# Patient Record
Sex: Female | Born: 1960
Health system: Southern US, Community
[De-identification: ages and names within clinical notes are randomized; demographics above are authoritative.]

---

## 2005-12-02 ENCOUNTER — Emergency Department: Payer: Self-pay | Admitting: Emergency Medicine

## 2018-01-15 ENCOUNTER — Other Ambulatory Visit: Payer: Self-pay

## 2018-01-15 ENCOUNTER — Ambulatory Visit
Admission: EM | Admit: 2018-01-15 | Discharge: 2018-01-15 | Disposition: A | Discharge status: Home / Self Care | Payer: Self-pay | Attending: Family Medicine | Admitting: Family Medicine

## 2018-01-15 DIAGNOSIS — A084 Viral intestinal infection, unspecified: Secondary | ICD-10-CM

## 2018-01-15 LAB — BASIC METABOLIC PANEL
ANION GAP: 10 (ref 5–15)
BUN: 9 mg/dL (ref 6–20)
CO2: 23 mmol/L (ref 22–32)
Calcium: 8.9 mg/dL (ref 8.9–10.3)
Chloride: 104 mmol/L (ref 101–111)
Creatinine, Ser: 0.86 mg/dL (ref 0.44–1.00)
Glucose, Bld: 97 mg/dL (ref 65–99)
POTASSIUM: 3.5 mmol/L (ref 3.5–5.1)
SODIUM: 137 mmol/L (ref 135–145)

## 2018-01-15 MED ORDER — ONDANSETRON 8 MG PO TBDP: 8.0000 mg | ORAL_TABLET | Freq: Three times a day (TID) | ORAL | 0 refills | Status: DC | PRN

## 2018-01-15 MED ORDER — ONDANSETRON 4 MG PO TBDP
4.0000 mg | ORAL_TABLET | Freq: Once | ORAL | Status: AC
  Administered 2018-01-15: 4 mg via ORAL

## 2018-01-15 NOTE — ED Provider Notes (Signed)
[facility]    CSN: [id number] Arrival date & time: 01/15/18  1427     History   Chief Complaint Chief Complaint  Patient presents with  . Nausea  . Diarrhea    HPI Doristine Johnsngelia M Kratochvil is a 57 y.o. female.   The history is provided by the patient.  Diarrhea  Quality:  Watery Severity:  Moderate Onset quality:  Sudden Number of episodes:  15 Duration:  3 days Timing:  Constant Progression:  Unchanged Relieved by:  None tried Associated symptoms: chills, URI and vomiting   Associated symptoms: no abdominal pain, no arthralgias, no recent cough, no diaphoresis, no headaches and no myalgias   Risk factors: no recent antibiotic use and no suspicious food intake     History reviewed. No pertinent past medical history.  There are no active problems to display for this patient.   Past Surgical History:  Procedure Laterality Date  . CESAREAN SECTION      OB History   None      Home Medications    Prior to Admission medications   Medication Sig Start Date End Date Taking? Authorizing Provider  ondansetron (ZOFRAN ODT) 8 MG disintegrating tablet Take 1 tablet (8 mg total) by mouth every 8 (eight) hours as needed. 01/15/18   Payton Mccallumonty, Jagjit Riner, MD    Family History History reviewed. No pertinent family history.  Social History Social History   Tobacco Use  . Smoking status: Current Some Day Smoker  . Smokeless tobacco: Never Used  Substance Use Topics  . Alcohol use: Not Currently  . Drug use: Not Currently     Allergies   Patient has no known allergies.   Review of Systems Review of Systems  Constitutional: Positive for chills. Negative for diaphoresis.  Gastrointestinal: Positive for diarrhea and vomiting. Negative for abdominal pain.  Musculoskeletal: Negative for arthralgias and myalgias.  Neurological: Negative for headaches.     Physical Exam Triage Vital Signs ED Triage Vitals  Enc Vitals Group     BP 01/15/18 1454 110/70   Pulse Rate 01/15/18 1454 78     Resp 01/15/18 1454 18     Temp 01/15/18 1454 98.2 F (36.8 C)     Temp src --      SpO2 01/15/18 1454 99 %     Weight 01/15/18 1455 161 lb (73 kg)     Height 01/15/18 1455 5' 0.5" (1.537 m)     Head Circumference --      Peak Flow --      Pain Score 01/15/18 1455 0     Pain Loc --      Pain Edu? --      Excl. in GC? --    No data found.  Updated Vital Signs BP 110/70 (BP Location: Right Arm)   Pulse 78   Temp 98.2 F (36.8 C)   Resp 18   Ht 5' 0.5" (1.537 m)   Wt 161 lb (73 kg)   SpO2 99%   BMI 30.93 kg/m   Visual Acuity Right Eye Distance:   Left Eye Distance:   Bilateral Distance:    Right Eye Near:   Left Eye Near:    Bilateral Near:     Physical Exam  Constitutional: She appears well-developed and well-nourished. No distress.  Abdominal: Soft. Bowel sounds are normal. She exhibits no distension and no mass. There is tenderness (mild; diffuse; no rebound or guarding). There is no rebound and no guarding.  Skin: She is  not diaphoretic.  Nursing note and vitals reviewed.    UC Treatments / Results  Labs (all labs ordered are listed, but only abnormal results are displayed) Labs Reviewed  BASIC METABOLIC PANEL    EKG None Radiology No results found.  Procedures Procedures (including critical care time)  Medications Ordered in UC Medications  ondansetron (ZOFRAN-ODT) disintegrating tablet 4 mg (4 mg Oral Given 01/15/18 1551)     Initial Impression / Assessment and Plan / UC Course  I have reviewed the triage vital signs and the nursing notes.  Pertinent labs & imaging results that were available during my care of the patient were reviewed by me and considered in my medical decision making (see chart for details).       Final Clinical Impressions(s) / UC Diagnoses   Final diagnoses:  Viral gastroenteritis    ED Discharge Orders        Ordered    ondansetron (ZOFRAN ODT) 8 MG disintegrating tablet  Every 8  hours PRN     01/15/18 1629     1. Lab results and diagnosis reviewed with patient 2. rx as per orders above; reviewed possible side effects, interactions, risks and benefits  3. Recommend supportive treatment with clear liquids, then advance slowly as tolerated  4. Follow-up prn if symptoms worsen or don't improve  Controlled Substance Prescriptions [place] Controlled Substance Registry consulted? Not Applicable   Payton Mccallum, MD 01/15/18 [phone]

## 2018-01-15 NOTE — ED Triage Notes (Signed)
2 weeks of feeling sick. Last weekend was vomiting. Then felt like she had the flu with achiness and cough. Those sx started to improve but she is now having n/v/d every time she eats.

## 2018-02-20 ENCOUNTER — Ambulatory Visit
Admission: EM | Admit: 2018-02-20 | Discharge: 2018-02-20 | Disposition: A | Discharge status: Home / Self Care | Payer: Self-pay | Attending: Family Medicine | Admitting: Family Medicine

## 2018-02-20 ENCOUNTER — Encounter: Payer: Self-pay | Admitting: *Deleted

## 2018-02-20 DIAGNOSIS — J0100 Acute maxillary sinusitis, unspecified: Secondary | ICD-10-CM

## 2018-02-20 DIAGNOSIS — R112 Nausea with vomiting, unspecified: Secondary | ICD-10-CM

## 2018-02-20 MED ORDER — ONDANSETRON 8 MG PO TBDP: 8.0000 mg | ORAL_TABLET | Freq: Three times a day (TID) | ORAL | 0 refills | Status: DC | PRN

## 2018-02-20 MED ORDER — AMOXICILLIN 875 MG PO TABS: 875.0000 mg | ORAL_TABLET | Freq: Two times a day (BID) | ORAL | 0 refills | Status: DC

## 2018-02-20 NOTE — ED Provider Notes (Signed)
[facility]    CSN: [id number] Arrival date & time: 02/20/18  1920     History   Chief Complaint Chief Complaint  Patient presents with  . Dizziness    HPI Tammie Berry is a 57 y.o. female.   The history is provided by the patient.  Dizziness  URI  Presenting symptoms: congestion, facial pain and rhinorrhea   Severity:  Moderate Onset quality:  Sudden Duration:  1 week Timing:  Constant Progression:  Worsening Chronicity:  New Relieved by:  None tried Ineffective treatments:  None tried Associated symptoms: myalgias   Associated symptoms: no wheezing   Associated symptoms comment:  Nausea, vomiting and diarrhea Risk factors: not elderly, no chronic cardiac disease, no chronic kidney disease, no chronic respiratory disease, no diabetes mellitus, no immunosuppression, no recent illness, no recent travel and no sick contacts     History reviewed. No pertinent past medical history.  There are no active problems to display for this patient.   Past Surgical History:  Procedure Laterality Date  . CESAREAN SECTION      OB History   None      Home Medications    Prior to Admission medications   Medication Sig Start Date End Date Taking? Authorizing Provider  fexofenadine (ALLEGRA) 60 MG tablet Take 60 mg by mouth 2 (two) times daily.   Yes [provider]  amoxicillin (AMOXIL) 875 MG tablet Take 1 tablet (875 mg total) by mouth 2 (two) times daily. 02/20/18   Payton Mccallum, MD  ondansetron (ZOFRAN ODT) 8 MG disintegrating tablet Take 1 tablet (8 mg total) by mouth every 8 (eight) hours as needed. 02/20/18   Payton Mccallum, MD    Family History History reviewed. No pertinent family history.  Social History Social History   Tobacco Use  . Smoking status: Current Some Day Smoker  . Smokeless tobacco: Never Used  Substance Use Topics  . Alcohol use: Not Currently  . Drug use: Not Currently     Allergies   Patient has no known  allergies.   Review of Systems Review of Systems  HENT: Positive for congestion and rhinorrhea.   Respiratory: Negative for wheezing.   Musculoskeletal: Positive for myalgias.  Neurological: Positive for dizziness.     Physical Exam Triage Vital Signs ED Triage Vitals  Enc Vitals Group     BP 02/20/18 1929 119/80     Pulse Rate 02/20/18 1929 85     Resp 02/20/18 1929 18     Temp 02/20/18 1929 98.1 F (36.7 C)     Temp Source 02/20/18 1929 Oral     SpO2 02/20/18 1929 100 %     Weight 02/20/18 1928 165 lb (74.8 kg)     Height 02/20/18 1928 5' (1.524 m)     Head Circumference --      Peak Flow --      Pain Score 02/20/18 1927 5     Pain Loc --      Pain Edu? --      Excl. in GC? --    No data found.  Updated Vital Signs BP 119/80 (BP Location: Left Arm)   Pulse 85   Temp 98.1 F (36.7 C) (Oral)   Resp 18   Ht 5' (1.524 m)   Wt 165 lb (74.8 kg)   SpO2 100%   BMI 32.22 kg/m   Visual Acuity Right Eye Distance:   Left Eye Distance:   Bilateral Distance:    Right  Eye Near:   Left Eye Near:    Bilateral Near:     Physical Exam  Constitutional: She appears well-developed and well-nourished. No distress.  HENT:  Head: Normocephalic and atraumatic.  Right Ear: Tympanic membrane, external ear and ear canal normal.  Left Ear: Tympanic membrane, external ear and ear canal normal.  Nose: Mucosal edema and rhinorrhea present. No nose lacerations, sinus tenderness, nasal deformity, septal deviation or nasal septal hematoma. No epistaxis.  No foreign bodies. Right sinus exhibits maxillary sinus tenderness and frontal sinus tenderness. Left sinus exhibits maxillary sinus tenderness and frontal sinus tenderness.  Mouth/Throat: Uvula is midline, oropharynx is clear and moist and mucous membranes are normal. No oropharyngeal exudate.  Eyes: Conjunctivae are normal. Right eye exhibits no discharge. Left eye exhibits no discharge. No scleral icterus.  Neck: Normal range of  motion. Neck supple. No thyromegaly present.  Cardiovascular: Normal rate, regular rhythm and normal heart sounds.  Pulmonary/Chest: Effort normal and breath sounds normal. No stridor. No respiratory distress. She has no wheezes. She has no rales.  Abdominal: Soft. Bowel sounds are normal. She exhibits no distension and no mass. There is no tenderness. There is no rebound and no guarding. No hernia.  Lymphadenopathy:    She has no cervical adenopathy.  Skin: She is not diaphoretic.  Nursing note and vitals reviewed.    UC Treatments / Results  Labs (all labs ordered are listed, but only abnormal results are displayed) Labs Reviewed - No data to display  EKG None  Radiology No results found.  Procedures Procedures (including critical care time)  Medications Ordered in UC Medications - No data to display  Initial Impression / Assessment and Plan / UC Course  I have reviewed the triage vital signs and the nursing notes.  Pertinent labs & imaging results that were available during my care of the patient were reviewed by me and considered in my medical decision making (see chart for details).      Final Clinical Impressions(s) / UC Diagnoses   Final diagnoses:  Acute maxillary sinusitis, recurrence not specified  Non-intractable vomiting with nausea, unspecified vomiting type    ED Prescriptions    Medication Sig Dispense Auth. Provider   amoxicillin (AMOXIL) 875 MG tablet Take 1 tablet (875 mg total) by mouth 2 (two) times daily. 20 tablet Ariyanna Oien, Pamala Hurry, MD   ondansetron (ZOFRAN ODT) 8 MG disintegrating tablet Take 1 tablet (8 mg total) by mouth every 8 (eight) hours as needed. 6 tablet Payton Mccallum, MD     1. diagnosis reviewed with patient 2. rx as per orders above; reviewed possible side effects, interactions, risks and benefits  3. Recommend supportive treatment with increased fluids  4. Follow-up prn if symptoms worsen or don't improve  Controlled Substance  Prescriptions [place] Controlled Substance Registry consulted? Not Applicable   Payton Mccallum, MD 02/20/18 2014

## 2018-02-20 NOTE — ED Triage Notes (Signed)
C/o dizziness, vomiting, diarrhea, sinus pressure and body aches. Pt started having these symptoms a week ago.

## 2018-08-31 ENCOUNTER — Other Ambulatory Visit: Payer: Self-pay

## 2018-08-31 ENCOUNTER — Ambulatory Visit
Admission: EM | Admit: 2018-08-31 | Discharge: 2018-08-31 | Disposition: A | Discharge status: Home / Self Care | Payer: 59 | Attending: Family Medicine | Admitting: Family Medicine

## 2018-08-31 ENCOUNTER — Encounter: Payer: Self-pay | Admitting: Emergency Medicine

## 2018-08-31 ENCOUNTER — Ambulatory Visit (INDEPENDENT_AMBULATORY_CARE_PROVIDER_SITE_OTHER): Payer: 59

## 2018-08-31 DIAGNOSIS — M25551 Pain in right hip: Secondary | ICD-10-CM | POA: Diagnosis not present

## 2018-08-31 DIAGNOSIS — M545 Low back pain: Secondary | ICD-10-CM | POA: Diagnosis not present

## 2018-08-31 DIAGNOSIS — M542 Cervicalgia: Secondary | ICD-10-CM

## 2018-08-31 DIAGNOSIS — M7918 Myalgia, other site: Secondary | ICD-10-CM | POA: Diagnosis not present

## 2018-08-31 MED ORDER — MELOXICAM 15 MG PO TABS: 15.0000 mg | ORAL_TABLET | Freq: Every day | ORAL | 0 refills | Status: DC | PRN

## 2018-08-31 NOTE — Discharge Instructions (Signed)
Xrays normal.  Medication as prescribed.  Take care  Dr. Adriana Simas

## 2018-08-31 NOTE — ED Provider Notes (Signed)
[facility]    CSN: [id number] Arrival date & time: 08/31/18  1124  History   Chief Complaint Chief Complaint  Patient presents with  . Knee Pain   HPI  57 year old female presents with pain.  Patient states that she initially developed right knee pain after hitting her knee on a metal bar.  Patient states that this occurred 1.5 months ago.  Pain has resolved in her knee but now she is having pain in the "right hip".  Patient localizes the pain to the anterolateral thigh.  She states it is worse with certain movements.  Denies back pain.  States that the pain is moderate to severe.  No relieving factors.  No medications or interventions tried.  No other complaints.  Social Hx reviewed as below. Social History Social History   Tobacco Use  . Smoking status: Current Some Day Smoker  . Smokeless tobacco: Never Used  Substance Use Topics  . Alcohol use: Yes    Comment: socially  . Drug use: Not Currently   Allergies   Patient has no known allergies.  Review of Systems Review of Systems  Constitutional: Negative.   Musculoskeletal:       Right hip pain.   Physical Exam Triage Vital Signs ED Triage Vitals  Enc Vitals Group     BP 08/31/18 1200 112/78     Pulse Rate 08/31/18 1200 74     Resp 08/31/18 1200 18     Temp 08/31/18 1200 98.2 F (36.8 C)     Temp Source 08/31/18 1200 Oral     SpO2 08/31/18 1200 100 %     Weight 08/31/18 1158 162 lb (73.5 kg)     Height 08/31/18 1158 5' (1.524 m)     Head Circumference --      Peak Flow --      Pain Score 08/31/18 1158 4     Pain Loc --      Pain Edu? --      Excl. in GC? --    Updated Vital Signs BP 112/78 (BP Location: Right Arm)   Pulse 74   Temp 98.2 F (36.8 C) (Oral)   Resp 18   Ht 5' (1.524 m)   Wt 73.5 kg   SpO2 100%   BMI 31.64 kg/m   Visual Acuity Right Eye Distance:   Left Eye Distance:   Bilateral Distance:    Right Eye Near:   Left Eye Near:    Bilateral Near:     Physical Exam   Constitutional: She is oriented to person, place, and time. She appears well-developed. No distress.  Cardiovascular: Normal rate and regular rhythm.  Pulmonary/Chest: Effort normal and breath sounds normal. She has no wheezes. She has no rales.  Musculoskeletal:  Right knee -no tenderness.  No effusion.  Ligaments intact.  Normal exam of the knee.  Right hip - pain with FADIR.  Tenderness around the IT band.   Neurological: She is alert and oriented to person, place, and time.  Psychiatric: She has a normal mood and affect. Her behavior is normal.  Nursing note and vitals reviewed.  UC Treatments / Results  Labs (all labs ordered are listed, but only abnormal results are displayed) Labs Reviewed - No data to display  EKG None  Radiology Dg Lumbar Spine Complete  Result Date: 08/31/2018 CLINICAL DATA:  Low back pain after injury 1 month ago. EXAM: LUMBAR SPINE - COMPLETE 4+ VIEW COMPARISON:  None. FINDINGS: There is no evidence  of lumbar spine fracture. Alignment is normal. Intervertebral disc spaces are maintained. IMPRESSION: Negative. Electronically Signed   By: Lupita Raider, M.D.   On: 08/31/2018 12:56   Dg Hip Unilat W Or Wo Pelvis 2-3 Views Right  Result Date: 08/31/2018 CLINICAL DATA:  Right hip pain after injury 1 month ago. EXAM: DG HIP (WITH OR WITHOUT PELVIS) 2-3V RIGHT COMPARISON:  None. FINDINGS: There is no evidence of hip fracture or dislocation. There is no evidence of arthropathy or other focal bone abnormality. IMPRESSION: Negative. Electronically Signed   By: Lupita Raider, M.D.   On: 08/31/2018 12:55    Procedures Procedures (including critical care time)  Medications Ordered in UC Medications - No data to display  Initial Impression / Assessment and Plan / UC Course  I have reviewed the triage vital signs and the nursing notes.  Pertinent labs & imaging results that were available during my care of the patient were reviewed by me and considered in my  medical decision making (see chart for details).    57 year old female presents with muscular skeletal pain.  X-rays negative.  Exam unrevealing.  Placing on meloxicam.  Supportive care.  Final Clinical Impressions(s) / UC Diagnoses   Final diagnoses:  Musculoskeletal pain     Discharge Instructions     Xrays normal.  Medication as prescribed.  Take care  Dr. Adriana Simas    ED Prescriptions    Medication Sig Dispense Auth. Provider   meloxicam (MOBIC) 15 MG tablet Take 1 tablet (15 mg total) by mouth daily as needed. 30 tablet Tommie Sams, DO     Controlled Substance Prescriptions [place] Controlled Substance Registry consulted? Not Applicable   Tommie Sams, DO 08/31/18 1313

## 2018-08-31 NOTE — ED Triage Notes (Signed)
Patient c/o hitting her knee on a metal bar at work 2 months ago. Patient stated she has continued to have pain and now the pain is radiating up into her right leg and hip.

## 2019-02-07 ENCOUNTER — Other Ambulatory Visit: Payer: Self-pay

## 2019-02-07 ENCOUNTER — Ambulatory Visit (INDEPENDENT_AMBULATORY_CARE_PROVIDER_SITE_OTHER)
Admit: 2019-02-07 | Discharge: 2019-02-07 | Disposition: A | Discharge status: Home / Self Care | Payer: 59 | Attending: Family Medicine | Admitting: Family Medicine

## 2019-02-07 ENCOUNTER — Encounter: Payer: Self-pay | Admitting: Emergency Medicine

## 2019-02-07 ENCOUNTER — Ambulatory Visit
Admission: EM | Admit: 2019-02-07 | Discharge: 2019-02-07 | Disposition: A | Discharge status: Home / Self Care | Payer: 59 | Attending: Family Medicine | Admitting: Family Medicine

## 2019-02-07 DIAGNOSIS — R42 Dizziness and giddiness: Secondary | ICD-10-CM | POA: Diagnosis not present

## 2019-02-07 DIAGNOSIS — E876 Hypokalemia: Secondary | ICD-10-CM

## 2019-02-07 DIAGNOSIS — F17210 Nicotine dependence, cigarettes, uncomplicated: Secondary | ICD-10-CM | POA: Diagnosis not present

## 2019-02-07 LAB — BASIC METABOLIC PANEL
Anion gap: 8 (ref 5–15)
BUN: 8 mg/dL (ref 6–20)
CO2: 23 mmol/L (ref 22–32)
Calcium: 9 mg/dL (ref 8.9–10.3)
Chloride: 106 mmol/L (ref 98–111)
Creatinine, Ser: 0.77 mg/dL (ref 0.44–1.00)
GFR calc Af Amer: >60 mL/min (ref >60)
GFR calc non Af Amer: >60 mL/min (ref >60)
Glucose, Bld: 92 mg/dL (ref 70–99)
Potassium: 3.2 mmol/L — ABNORMAL LOW (ref 3.5–5.1)
Sodium: 137 mmol/L (ref 135–145)

## 2019-02-07 MED ORDER — MECLIZINE HCL 25 MG PO TABS: 25.0000 mg | ORAL_TABLET | Freq: Three times a day (TID) | ORAL | 0 refills | Status: DC | PRN

## 2019-02-07 MED ORDER — POTASSIUM CHLORIDE CRYS ER 20 MEQ PO TBCR
40.0000 meq | EXTENDED_RELEASE_TABLET | Freq: Once | ORAL | Status: AC
  Administered 2019-02-07: 40 meq via ORAL

## 2019-02-07 NOTE — Discharge Instructions (Signed)
Take medication as prescribed. REST. Drink plenty of fluids. Monitor diet.  Follow up with your primary care physician this week for follow up. Return to Urgent care or emergency room for new or worsening concerns.

## 2019-02-07 NOTE — ED Triage Notes (Signed)
Patient c/o dizziness that started on Monday. She stated during her shift she became dizzy and had to go home. She states she has had episodes every day. Denies chest pain.  Patient also reports that she is feeling more anxious than usual.

## 2019-02-07 NOTE — ED Provider Notes (Signed)
[facility] ____________________________________________  Time seen: Approximately 3:47 PM  I have reviewed the triage vital signs and the nursing notes.   HISTORY  Chief Complaint Dizziness   HPI Tammie Berry is a 58 y.o. female presenting for evaluation of dizziness present for the last 3 days.  States dizziness is intermittent and described as room moving sensation. Denies current dizziness. Denies any fall, head injury, syncope or near syncope.  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  States initially this was accompanied by a generalized mild to moderate headache.  States the dizziness is present only with movement, particularly when getting up.  States when lying and resting flat she does not notice the dizziness.  States she has been under a lot of stress recently including stress at work, stress of overall COVID-19 as well as her kids have been at home.  Further states due to these changes she has not been getting much sleep in the last week.  States she works till 6 AM and normally is able to get several hours of sleep, but reports over the last week she has been maybe getting 4 hours of sleep and then returning back to work.  Reports she has had intermittent very mild headache yesterday and today, that is improved with ibuprofen. States "minimal" "nagging" headache today.  Denies any weakness, unilateral weakness, paresthesias, vision changes, chest pain, shortness of breath, fevers, extremity edema, confusion or difficulty focusing.  Overall continues to eat and drink well.  States she does have a history of similar happening occasionally in the past that would go away after getting enough rest.  Denies hypertension, hyperlipidemia or diabetes.     History reviewed. No pertinent past medical history. Denies   There are no active problems to display for this patient.   Past Surgical History:  Procedure Laterality  Date  . CESAREAN SECTION    . CESAREAN SECTION       No current facility-administered medications for this encounter.   Current Outpatient Medications:  .  meclizine (ANTIVERT) 25 MG tablet, Take 1 tablet (25 mg total) by mouth 3 (three) times daily as needed for dizziness., Disp: 20 tablet, Rfl: 0 .  meloxicam (MOBIC) 15 MG tablet, Take 1 tablet (15 mg total) by mouth daily as needed., Disp: 30 tablet, Rfl: 0  Allergies Patient has no known allergies.  family history Mother: " Thyroid issues" Siblings: Diabetes  Social History Social History   Tobacco Use  . Smoking status: Current Some Day Smoker  . Smokeless tobacco: Never Used  Substance Use Topics  . Alcohol use: Yes    Comment: socially  . Drug use: Not Currently    Review of Systems Constitutional: No fever Eyes: No visual changes. ENT: No sore throat. Cardiovascular: Denies chest pain. Respiratory: Denies shortness of breath. Gastrointestinal: No abdominal pain.  No nausea, no vomiting.  No diarrhea.   Musculoskeletal: Negative for back pain. Skin: Negative for rash. Neurological: Negative for focal weakness or numbness.  Positive dizziness and positive intermittent headache.    ____________________________________________   PHYSICAL EXAM:  VITAL SIGNS: ED Triage Vitals  Enc Vitals Group     BP 02/07/19 1511 119/75     Pulse Rate 02/07/19 1511 89     Resp 02/07/19 1511 18     Temp 02/07/19 1511 98.2 F (36.8 C)     Temp Source 02/07/19 1511 Oral     SpO2 02/07/19 1511 100 %  Weight 02/07/19 1512 160 lb (72.6 kg)     Height 02/07/19 1512 5\' 2"  (1.575 m)     Head Circumference --      Peak Flow --      Pain Score 02/07/19 1511 6     Pain Loc --      Pain Edu? --      Excl. in GC? --     Constitutional: Alert and oriented. Well appearing and in no acute distress. Eyes: Conjunctivae are normal. PERRL. EOMI. no pain with EOMs. ENT      Head: Normocephalic and atraumatic.      Ears:  Nontender, normal canal, normal TMs bilaterally.      Nose: No congestion      Mouth/Throat: Mucous membranes are moist.Oropharynx non-erythematous. Neck: No stridor. Supple without meningismus.  Hematological/Lymphatic/Immunilogical: No cervical lymphadenopathy. Cardiovascular: Normal rate, regular rhythm. Grossly normal heart sounds.  Good peripheral circulation. Respiratory: Normal respiratory effort without tachypnea nor retractions. Breath sounds are clear and equal bilaterally. No wheezes, rales, rhonchi. Musculoskeletal: Steady gait. Neurologic:  Normal speech and language. No gross focal neurologic deficits are appreciated. Speech is normal. No gait instability.  Negative pronator drift.  Negative Romberg.  No ataxia.  No paresthesias. 5/5 strength to bilateral upper and lower extremities.  Hand grip strong and equal.  Mildly positive right Dix-Hallpike maneuver. Skin:  Skin is warm, dry and intact. No rash noted. Psychiatric: Mood and affect are normal. Speech and behavior are normal. Patient exhibits appropriate insight and judgment   ___________________________________________   LABS (all labs ordered are listed, but only abnormal results are displayed)  Labs Reviewed  BASIC METABOLIC PANEL - Abnormal; Notable for the following components:      Result Value   Potassium 3.2 (*)    All other components within normal limits    RADIOLOGY  Ct Head Wo Contrast  Result Date: 02/07/2019 CLINICAL DATA:  58 year old female with dizziness EXAM: CT HEAD WITHOUT CONTRAST TECHNIQUE: Contiguous axial images were obtained from the base of the skull through the vertex without intravenous contrast. COMPARISON:  None. FINDINGS: Brain: No acute intracranial hemorrhage. No midline shift or mass effect. Gray-white differentiation maintained. Unremarkable appearance of the ventricular system. Vascular: Unremarkable. Skull: No acute fracture.  No aggressive bone lesion identified. Sinuses/Orbits:  Unremarkable appearance of the orbits. Mastoid air cells clear. No middle ear effusion. No significant sinus disease. Other: None IMPRESSION: Negative for acute intracranial abnormality Electronically Signed   By: Gilmer Mor D.O.   On: 02/07/2019 16:33   ____________________________________________   PROCEDURES Procedures   INITIAL IMPRESSION / ASSESSMENT AND PLAN / ED COURSE  Pertinent labs & imaging results that were available during my care of the patient were reviewed by me and considered in my medical decision making (see chart for details).  Well-appearing patient.  No acute distress.  No focal neurological deficits.  Intermittent dizziness with position changes and intermittent headache for the last 2 to 3 days.  Also with recent lack of sleep due to kids at home and she working the night shift, as well as increased stress.  History of similar happening intermittently in past.  Mildly positive right Dix-Hallpike maneuver.  Suspect benign positional vertigo combined with situational stress and lack of sleep.  Will evaluate BMP and CT head.  Potassium 3.2, 40 mEq K-Dur given in office.  Counseled to follow-up for recheck.  CT head as above radiologist, negative for acute intracranial abnormality.  Patient doing well in room. Recommend supportive care,  rest, adequate sleep, meclizine as needed, closely monitoring symptoms.  Follow-up with primary care this week.  Discussed proceed directly to emergency room for worsening complaints.Discussed indication, risks and benefits of medications with patient. Discussed follow up and return parameters including no resolution or any worsening concerns. Patient verbalized understanding and agreed to plan.   ____________________________________________   FINAL CLINICAL IMPRESSION(S) / ED DIAGNOSES  Final diagnoses:  Dizziness  Hypokalemia     ED Discharge Orders         Ordered    meclizine (ANTIVERT) 25 MG tablet  3 times daily PRN      02/07/19 1638           Note: This dictation was prepared with Dragon dictation along with smaller phrase technology. Any transcriptional errors that result from this process are unintentional.         Renford Dills, NP 02/07/19 1703

## 2019-02-07 NOTE — ED Notes (Signed)
CT Authorization obtained from [facility] Confirm # [id number] Vaild from 02/07/2019-03/24/2019

## 2019-02-26 ENCOUNTER — Encounter: Payer: Self-pay | Admitting: Emergency Medicine

## 2019-02-26 ENCOUNTER — Ambulatory Visit: Admission: EM | Admit: 2019-02-26 | Discharge: 2019-02-26 | Discharge status: Absent without leave | Payer: Self-pay

## 2019-02-26 ENCOUNTER — Other Ambulatory Visit: Payer: Self-pay

## 2019-02-26 ENCOUNTER — Ambulatory Visit
Admission: EM | Admit: 2019-02-26 | Discharge: 2019-02-26 | Disposition: A | Discharge status: Home / Self Care | Payer: 59 | Attending: Family Medicine | Admitting: Family Medicine

## 2019-02-26 DIAGNOSIS — H5789 Other specified disorders of eye and adnexa: Secondary | ICD-10-CM

## 2019-02-26 MED ORDER — OLOPATADINE HCL 0.2 % OP SOLN: OPHTHALMIC | 0 refills | Status: DC

## 2019-02-26 NOTE — Discharge Instructions (Signed)
Call [facility] with concerns.  Eye drop as prescribed.  Take care  Dr. Adriana Simas

## 2019-02-26 NOTE — ED Triage Notes (Signed)
Patient c/o redness and drainage from left eye since Saturday.  Patient denies fevers.

## 2019-02-28 NOTE — ED Provider Notes (Signed)
[facility]    CSN: [id number] Arrival date & time: 02/26/19  1740  History   Chief Complaint Chief Complaint  Patient presents with  . Eye Problem    left eye   HPI  58 year old female presents with the above complaints.  Patient reports that she was sent home from work on Thursday for eye redness.  Patient states she did not notice any eye redness until Saturday.  She states that she has had a left eye redness and watering.  No discrete drainage or crusting.  No fever.  Patient's vision is quite poor today.  Patient states that she likely needs glasses but has not seen an eye doctor.  No reports of eye pain.  No photophobia.  No known exacerbating relieving factors.  Patient states that 1 of her coworkers had pinkeye.  She does note that the eye itches.  No other associated symptoms.  No other complaints.  History reviewed as below. PMH: Dizziness  Surgical Hx: C- Section  OB History   No obstetric history on file.    Home Medications    Prior to Admission medications   Medication Sig Start Date End Date Taking? Authorizing Provider  meclizine (ANTIVERT) 25 MG tablet Take 1 tablet (25 mg total) by mouth 3 (three) times daily as needed for dizziness. 02/07/19  Yes Renford DillsMiller, Lindsey, NP  meloxicam (MOBIC) 15 MG tablet Take 1 tablet (15 mg total) by mouth daily as needed. 08/31/18   Tommie Samsook, Kia Varnadore G, DO  Olopatadine HCl 0.2 % SOLN 1 drop to affected eye daily. 02/26/19   Tommie Samsook, Delissa Silba G, DO   Social History Social History   Tobacco Use  . Smoking status: Current Some Day Smoker    Types: Cigarettes  . Smokeless tobacco: Never Used  Substance Use Topics  . Alcohol use: Yes    Comment: socially  . Drug use: Not Currently    Allergies   Patient has no known allergies.   Review of Systems Review of Systems  Constitutional: Negative.   Eyes: Positive for redness and itching. Negative for photophobia and visual disturbance.   Physical Exam Triage Vital Signs ED  Triage Vitals  Enc Vitals Group     BP 02/26/19 1750 125/68     Pulse Rate 02/26/19 1750 92     Resp 02/26/19 1750 14     Temp 02/26/19 1750 98.3 F (36.8 C)     Temp Source 02/26/19 1750 Oral     SpO2 02/26/19 1750 100 %     Weight 02/26/19 1748 160 lb (72.6 kg)     Height 02/26/19 1748 5\' 2"  (1.575 m)     Head Circumference --      Peak Flow --      Pain Score 02/26/19 1748 0     Pain Loc --      Pain Edu? --      Excl. in GC? --    Updated Vital Signs BP 125/68 (BP Location: Left Arm)   Pulse 92   Temp 98.3 F (36.8 C) (Oral)   Resp 14   Ht 5\' 2"  (1.575 m)   Wt 72.6 kg   SpO2 100%   BMI 29.26 kg/m   Visual Acuity Right Eye Distance: 20/70 uncorrected Left Eye Distance: 20/200 uncorrected Bilateral Distance: 20/50 uncorrected  Right Eye Near:   Left Eye Near:    Bilateral Near:     Physical Exam Vitals signs and nursing note reviewed.  Constitutional:  General: She is not in acute distress.    Appearance: Normal appearance.  HENT:     Head: Normocephalic and atraumatic.     Nose: Nose normal.  Eyes:     General:        Right eye: No discharge.        Left eye: No discharge.     Conjunctiva/sclera: Conjunctivae normal.  Cardiovascular:     Rate and Rhythm: Normal rate and regular rhythm.  Pulmonary:     Effort: Pulmonary effort is normal.     Breath sounds: Normal breath sounds.  Neurological:     Mental Status: She is alert.  Psychiatric:        Mood and Affect: Mood normal.        Behavior: Behavior normal.    UC Treatments / Results  Labs (all labs ordered are listed, but only abnormal results are displayed) Labs Reviewed - No data to display  EKG None  Radiology No results found.  Procedures Procedures (including critical care time)  Medications Ordered in UC Medications - No data to display  Initial Impression / Assessment and Plan / UC Course  I have reviewed the triage vital signs and the nursing notes.  Pertinent labs &  imaging results that were available during my care of the patient were reviewed by me and considered in my medical decision making (see chart for details).    58 year old female presents with reported eye redness and eye watering/itching.  Her exam is essentially normal today.  Placing Pataday for suspected allergic conjunctivitis.  Advised patient that she needs to see an eye doctor in the near future.  Final Clinical Impressions(s) / UC Diagnoses   Final diagnoses:  Eye irritation     Discharge Instructions     Call [facility] with concerns.  Eye drop as prescribed.  Take care  Dr. Adriana Simas    ED Prescriptions    Medication Sig Dispense Auth. Provider   Olopatadine HCl 0.2 % SOLN 1 drop to affected eye daily. 2.5 mL Tommie Sams, DO     Controlled Substance Prescriptions [place] Controlled Substance Registry consulted? Not Applicable   Tommie Sams, DO 02/28/19 [phone]

## 2019-03-13 ENCOUNTER — Encounter: Payer: Self-pay | Admitting: Emergency Medicine

## 2019-03-13 ENCOUNTER — Other Ambulatory Visit: Payer: Self-pay

## 2019-03-13 ENCOUNTER — Ambulatory Visit
Admission: EM | Admit: 2019-03-13 | Discharge: 2019-03-13 | Disposition: A | Discharge status: Home / Self Care | Payer: No Typology Code available for payment source | Attending: Family Medicine | Admitting: Family Medicine

## 2019-03-13 DIAGNOSIS — M659 Synovitis and tenosynovitis, unspecified: Secondary | ICD-10-CM | POA: Diagnosis not present

## 2019-03-13 MED ORDER — METHYLPREDNISOLONE 4 MG PO TBPK: ORAL_TABLET | ORAL | 0 refills | Status: DC

## 2019-03-13 MED ORDER — IBUPROFEN 800 MG PO TABS: 800.0000 mg | ORAL_TABLET | Freq: Three times a day (TID) | ORAL | 0 refills | Status: DC

## 2019-03-13 NOTE — ED Notes (Signed)
Urine Drug Screen collected per protocol and placed in [facility] for pick up.

## 2019-03-13 NOTE — Discharge Instructions (Addendum)
It was very nice meeting you today in clinic. Thank you for entrusting me with your care.   As discussed, it sounds like a repetitive motion injury.  Please utilize the medications that we discussed. Your prescriptions have been called in to your pharmacy.  Ice joint 10-15 minutes at a time 3-4 times a day.  Wear brace.   Make arrangements to follow up with orthopedics in 1 week for re-evaluation. If your symptoms/condition worsens, please seek follow up care either here or in the ER. Please remember, our [facility] providers are "right here with you" when you need [place].   Again, it was my pleasure to take care of you today. Thank you for choosing our clinic. I hope that you start to feel better quickly.   Quentin Mulling, MSN, APRN, FNP-C, CEN Advanced Practice Provider [facility]

## 2019-03-13 NOTE — ED Triage Notes (Signed)
Patient states that she injured her left wrist at work on Sunday.  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  Patient denies fall or a specific injury.

## 2019-03-13 NOTE — ED Provider Notes (Signed)
[address], [address] [place], [place] [address] [phone]   Name: Tammie Berry DOB: 09/28/1961 MRN: [id number] CSN: [id number] PCP: Patient, No Pcp Per  Arrival date and time:  03/13/19 0919  Chief Complaint:  Wrist Pain (left) and Worker's Comp Injury  NOTE: Prior to seeing the patient today, I have reviewed the triage nursing documentation and vital signs. Clinical staff has updated patient's PMH/PSHx, current medication list, and drug allergies/intolerances to ensure comprehensive history available to assist in medical decision making.   History:   HPI: Tammie Berry is a 58 y.o. female who presents today with complaints of pain in her LEFT wrist that began with acute onset on Sunday (03/11/2019). Patient was performing her normal job duties at [employer] when the pain began. Patient describes her job as being repetitive in nature. 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 Patient continued with work as it was almost time for her shift to end. Since Sunday, her symptoms have worsened and patient describes tenderness and "electrical shocks shooting up the arm".   Patient has been wearing a wrist (soft) brace and has taken a single dose of IBU 400 mg. Symptoms have persisted despite interventions.   History reviewed. No pertinent past medical history.  Past Surgical History:  Procedure Laterality Date  . CESAREAN SECTION    . CESAREAN SECTION      History reviewed. No pertinent family history.  Social History   Socioeconomic History  . Marital status: Unknown    Spouse name: Not on file  . Number of children: Not on file  . Years of education: Not on file  . Highest education level: Not on file  Occupational History  . Not on file  Social Needs  . Financial resource strain: Not on file  . Food insecurity:    Worry: Not on file    Inability: Not on  file  . Transportation needs:    Medical: Not on file    Non-medical: Not on file  Tobacco Use  . Smoking status: Current Some Day Smoker    Types: Cigarettes  . Smokeless tobacco: Never Used  Substance and Sexual Activity  . Alcohol use: Yes    Comment: socially  . Drug use: Not Currently  . Sexual activity: Not on file  Lifestyle  . Physical activity:    Days per week: Not on file    Minutes per session: Not on file  . Stress: Not on file  Relationships  . Social connections:    Talks on phone: Not on file    Gets together: Not on file    Attends religious service: Not on file    Active member of club or organization: Not on file    Attends meetings of clubs or organizations: Not on file    Relationship status: Not on file  . Intimate partner violence:    Fear of current or ex partner: Not on file    Emotionally abused: Not on file    Physically abused: Not on file    Forced sexual activity: Not on file  Other Topics Concern  . Not on file  Social History Narrative  . Not on file    There are no active problems to display for this patient.   Home Medications:    No outpatient medications have been marked as taking for  the 03/13/19 encounter [facility] Encounter).    Allergies:   Patient has no known allergies.  Review of Systems (ROS): Review of Systems  Constitutional: Negative for chills and fever.  Respiratory: Negative for cough and shortness of breath.   Cardiovascular: Negative for chest pain and palpitations.  Musculoskeletal:       Pain and swelling to LEFT wrist  Neurological: Positive for numbness (LEFT wrist entending up arm).     Physical Exam:  Triage Vital Signs ED Triage Vitals  Enc Vitals Group     BP 03/13/19 0935 114/83     Pulse Rate 03/13/19 0935 90     Resp 03/13/19 0935 16     Temp 03/13/19 0935 98.2 F (36.8 C)     Temp Source 03/13/19 0935 Oral     SpO2 03/13/19 0935 98 %     Weight 03/13/19 0933 155 lb (70.3 kg)      Height 03/13/19 0933  (1.575 m)     Head Circumference --      Peak Flow --      Pain Score 03/13/19 0932 5     Pain Loc --      Pain Edu? --      Excl. in GC? --     Physical Exam  Constitutional: She is oriented to person, place, and time and well-developed, well-nourished, and in no distress.  HENT:  Head: Normocephalic and atraumatic.  Mouth/Throat: Mucous membranes are normal.  Cardiovascular: Normal rate, regular rhythm, normal heart sounds and intact distal pulses. Exam reveals no gallop and no friction rub.  No murmur heard. Pulmonary/Chest: Effort normal and breath sounds normal. No respiratory distress. She has no wheezes. She has no rales.  Musculoskeletal:     Left wrist: She exhibits tenderness (dorsal radial distribution) and swelling (slight). She exhibits normal range of motion, no effusion, no crepitus and no deformity.     Comments: Sensation of "electrical shocks" shooting up arm with normal AROM.  Neurological: She is alert and oriented to person, place, and time.  Skin: Skin is warm and dry. No rash noted. No erythema.  Psychiatric: Mood, affect and judgment normal.  Nursing note and vitals reviewed.    Urgent Care Treatments / Results:   LABS: PLEASE NOTE: all labs that were ordered this encounter are listed, however only abnormal results are displayed. Labs Reviewed - No data to display  EKG: -None  RADIOLOGY: No results found.  PRODEDURES: Procedures  MEDICATIONS RECEIVED THIS VISIT: Medications - No data to display  PERTINENT CLINICAL COURSE NOTES/UPDATES: No data to display   Initial Impression / Assessment and Plan / Urgent Care Course:    Tammie Berry is a 58 y.o. female who presents to [facility] today with complaints of Wrist Pain (left) and Worker's Comp Injury  Pertinent labs & imaging results that were available during my care of the patient were personally reviewed by me and considered in my medical decision making  (see lab/imaging section of note for values and interpretations).  Exam reveals no obvious deformities. Symptoms consistent with dorsal radial tendinopathy. Encouraged to keep splint in place and to avoid repetitive movements to promote improvement in her pain and swelling. Will treat with scheduled anti-inflammatory (IBU) medications x 1 week. Given the extent of patient's symptoms, limited movement of hand/wrist, and radial radiculopathy will treat with systemic steroid taper (Medrol). Patient educated on cryotherapy. She will follow up with occupational medicine [facility], FNP-C) on 03/16/2019 for RTW clearance. Patient  advised to call today to get on schedule for follow up later this week. If patient still having pain, she has been given the name of a local orthopedic provider Rosita Kea(Menz, MD) to see for further evaluation and ongoing management.   Current clinical condition warrants patient being out of work in order to recover from her current injury/illness. She was provided with the appropriate documentation to provide to her place of employment that will allow for her to RTW on 03/16/2019 with restrictions (brace in place, avoid repetitive motions) until cleared by occupation medicine and/or orthopedics.    I have reviewed the follow up and strict return precautions for any new or worsening symptoms. Patient is aware of symptoms that would be deemed urgent/emergent, and would thus require further evaluation either here or in the emergency department. At the time of discharge, she verbalized understanding and consent with the discharge plan as it was reviewed with her. All questions were fielded by provider and/or clinic staff prior to patient discharge.    Final Clinical Impressions(s) / Urgent Care Diagnoses:   Final diagnoses:  Tenosynovitis of left wrist    New Prescriptions:   Meds ordered this encounter  Medications  . methylPREDNISolone (MEDROL DOSEPAK) 4 MG TBPK tablet    Sig: Taper per  package instructions    Dispense:  21 tablet    Refill:  0  . ibuprofen (ADVIL) 800 MG tablet    Sig: Take 1 tablet (800 mg total) by mouth 3 (three) times daily.    Dispense:  21 tablet    Refill:  0    Controlled Substance Prescriptions:  [place] Controlled Substance Registry consulted? Not Applicable  NOTE: This note was prepared using Dragon dictation software along with smaller phrase technology. Despite my best ability to proofread, there is the potential that transcriptional errors may still occur from this process, and are completely unintentional.     Verlee MonteGray, Beyza Bellino E, NP 03/13/19 1059

## 2019-07-17 ENCOUNTER — Ambulatory Visit
Admission: EM | Admit: 2019-07-17 | Discharge: 2019-07-17 | Disposition: A | Discharge status: Home / Self Care | Payer: Worker's Compensation | Attending: Urgent Care | Admitting: Urgent Care

## 2019-07-17 ENCOUNTER — Other Ambulatory Visit: Payer: Self-pay

## 2019-07-17 DIAGNOSIS — Y990 Civilian activity done for income or pay: Secondary | ICD-10-CM

## 2019-07-17 DIAGNOSIS — Z77098 Contact with and (suspected) exposure to other hazardous, chiefly nonmedicinal, chemicals: Secondary | ICD-10-CM

## 2019-07-17 MED ORDER — FLUORESCEIN SODIUM 1 MG OP STRP: 1.0000 | ORAL_STRIP | Freq: Once | OPHTHALMIC | Status: DC

## 2019-07-17 MED ORDER — TETRACAINE HCL 0.5 % OP SOLN: 1.0000 [drp] | Freq: Once | OPHTHALMIC | Status: DC

## 2019-07-17 MED ORDER — MOXIFLOXACIN HCL 0.5 % OP SOLN: 1.0000 [drp] | Freq: Three times a day (TID) | OPHTHALMIC | 0 refills | Status: DC

## 2019-07-17 MED ORDER — FLUORESCEIN SODIUM 1 MG OP STRP
1.0000 | ORAL_STRIP | Freq: Once | OPHTHALMIC | Status: AC
  Administered 2019-07-17: 19:00:00 1 via OPHTHALMIC

## 2019-07-17 MED ORDER — TETRACAINE HCL 0.5 % OP SOLN
1.0000 [drp] | Freq: Once | OPHTHALMIC | Status: AC
  Administered 2019-07-17: 19:00:00 1 [drp] via OPHTHALMIC

## 2019-07-17 NOTE — ED Triage Notes (Signed)
Patient states that when she went in to work last night they require them to sanitize their shoes. States that instead of spraying her shoes it sprayed her in the right eye. Patient states that it is similar to alcohol. Patient reports that eye has continued to hurt since.

## 2019-07-17 NOTE — ED Provider Notes (Signed)
[place], [place]   Name: Tammie Berry DOB: 09/13/1961 MRN: [id number] CSN: [id number] PCP: Patient, No Pcp Per  Arrival date and time:  07/17/19 1803  Chief Complaint:  Eye pain (right)  NOTE: Prior to seeing the patient today, I have reviewed the triage nursing documentation and vital signs. Clinical staff has updated patient's PMH/PSHx, current medication list, and drug allergies/intolerances to ensure comprehensive history available to assist in medical decision making.   History:   HPI: Tammie Berry is a 58 y.o. female who presents today with complaints of pain and redness in her RIGHT eye that began following a chemical exposure that occurred on 07/16/2019. Patient reports that she was performing her normal job duties today at [employer] when the injury occurred. 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 At approximately 2250 last night, patient was cleaning her shoes prior to starting her work when the Alpet solution splashed into her RIGHT eye. Patient states, "it is basically an alcohol based cleaner". Patient reports immediate burning prompting a 1 minute eye wash using a nearby sink. Patient was then assisted to and OSHA approved eye wash station where she flushed her eyes for an additional 3-4 minutes. Eye was reported to be erythematous and continued to burn despite flushing. Patient was sent home from work. She states, "my eye hurt and I didn't want to go sit in the ER, so I went home. I didn't think it was that bad". Today, patient advises that her eye has continued to be painful and that her vision has been blurred. She denies any drainage from the eye. She does not wear contact lenses. Beyond flushing the eye, she has not used any types of drops/medications since the incident occurred.   Visual Acuity: Right Eye Distance: 20/100 Left Eye  Distance: 20/50 Bilateral Distance: 20/50(uncorrected)  History reviewed. No pertinent past medical history.  Past Surgical History:  Procedure Laterality Date  . CESAREAN SECTION    . CESAREAN SECTION      History reviewed. No pertinent family history.  Social History   Tobacco Use  . Smoking status: Current Some Day Smoker    Types: Cigarettes  . Smokeless tobacco: Never Used  Substance Use Topics  . Alcohol use: Yes    Comment: socially  . Drug use: Not Currently    There are no active problems to display for this patient.   Home Medications:    No outpatient medications have been marked as taking for the 07/17/19 encounter [facility] Encounter).    Allergies:   Patient has no known allergies.  Review of Systems (ROS): Review of Systems  Constitutional: Negative for chills and fever.  HENT: Negative for facial swelling and trouble swallowing.   Eyes: Positive for pain and redness. Negative for photophobia, discharge, itching and visual disturbance.  Respiratory: Negative for chest tightness, shortness of breath and wheezing.   Gastrointestinal: Negative for nausea and vomiting.  Neurological: Negative for dizziness, syncope and headaches.  All other systems reviewed and are negative.    Vital Signs: Today's Vitals   07/17/19 1831 07/17/19 1833 07/17/19 1935  BP:  137/79   Pulse:  92   Resp:  16   Temp:  98.4 F (36.9 C)   TempSrc:  Oral   SpO2:  100%   Weight: 160 lb (72.6 kg)    Height: 5\' 2"  (1.575 m)    PainSc: 7  7     Physical Exam: Physical Exam  Constitutional: She is oriented to person, place, and time and well-developed, well-nourished, and in no distress.  HENT:  Head: Normocephalic and atraumatic.  Mouth/Throat: Mucous membranes are normal.  Eyes: Pupils are equal, round, and reactive to light. EOM are normal. Right eye exhibits no discharge and no exudate. Right conjunctiva is injected. Right conjunctiva has no hemorrhage. No scleral  icterus.  No obvious areas of ulceration. No drainage. Nitrazine paper revealed eye pH of 7.   Wood's lamp examination of the RIGHT eye . Consent: Procedure explained to patient prior to performing. Drug allergies reviewed. Verbal consent obtained.  . Procedural course: Topical anesthetic (tetracaine) gtts instilled into the affected eye. Adequate time allowed for medication to be effective. Fluorescein stain applied. Eye thoroughly examined under black light.  . Findings: Examination revealed increased fluorescein uptake to the lower outer aspect of the cornea. . Tolerance: Patient tolerated exam/procedure well Cardiovascular: Normal rate.  Pulmonary/Chest: Effort normal. No respiratory distress.  Neurological: She is alert and oriented to person, place, and time. Skin: Skin is warm and dry. No rash noted.  Psychiatric: Mood, memory, affect and judgment normal.  Nursing note and vitals reviewed.  Urgent Care Treatments / Results:   LABS: PLEASE NOTE: all labs that were ordered this encounter are listed, however only abnormal results are displayed. Labs Reviewed - No data to display  EKG: -None  RADIOLOGY: No results found.  MEDICATIONS RECEIVED THIS VISIT: Medications  fluorescein ophthalmic strip 1 strip (1 strip Right Eye Given 07/17/19 1921)  tetracaine (PONTOCAINE) 0.5 % ophthalmic solution 1 drop (1 drop Right Eye Given 07/17/19 1922)    PERTINENT CLINICAL COURSE NOTES/UPDATES: Clinical Course as of Jul 17 1214  Tue Jul 17, 2019  4383 Spoke with ophthalmology provider Druscilla Brownie, MD) to discuss exposure and complaints of eye pain and blurred vision. Recommended fluorescein staining to assess for chemical corneal abrasion. If increased stain uptake noted, MD recommended treatment with Vigamox or Polytrim and follow up with opthalmology tomorrow.     [BG]    Clinical Course User Index [BG] Verlee Monte, NP   Initial Impression / Assessment and Plan / Urgent Care Course:   Pertinent labs & imaging results that were available during my care of the patient were personally reviewed by me and considered in my medical decision making (see lab/imaging section of note for values and interpretations).  Tammie Berry is a 58 y.o. female who presents to [facility] today with complaints of eye pain (right).  Patient is well appearing overall in clinic today. She does not appear to be in any acute distress. Presenting symptoms (see HPI) and exam as documented above. Discussed eye exam with patient and concerns for chemical corneal abrasion. Reviewed recommendations from eye specialist. Given the increased uptake, will proceed with treatment using Vigamox gtts. Patient advised that she could use cool compresses over the eye to help soothe. May use Tylenol and/or Ibuprofen as needed for discomfort. Documentation provided for patient to remain out of work tonight. Worker's compensation documentation completed advising of need for immediate need for patient to be seen by ophthalmology tomorrow (07/18/2019) for evaluation by specialist. She was asked to provide documentation to employer tonight in order to determine an approved ophthalmology provider that she could see tomorrow. Stressed that delay in evaluation could lead to ocular complications that will ultimately result in long term problems with her vision. Patient verbalized understanding.   I have reviewed  the follow up and strict return precautions for any new or worsening symptoms. Patient is aware of symptoms that would be deemed urgent/emergent, and would thus require further evaluation either here or in the emergency department. At the time of discharge, she verbalized understanding and consent with the discharge plan as it was reviewed with her. All questions were fielded by provider and/or clinic staff prior to patient discharge.    Final Clinical Impressions / Urgent Care Diagnoses:   Final diagnoses:   Chemical exposure of eye  Work related injury    New Prescriptions:  [place] Controlled Substance Registry consulted? Not Applicable  Meds ordered this encounter  Medications  . fluorescein ophthalmic strip 1 strip  . tetracaine (PONTOCAINE) 0.5 % ophthalmic solution 1 drop  . moxifloxacin (VIGAMOX) 0.5 % ophthalmic solution    Sig: Place 1 drop into the right eye 3 (three) times daily.    Dispense:  3 mL    Refill:  0   Recommended Follow up Care:  Patient encouraged to follow up with the following provider within the specified time frame, or sooner as dictated by the severity of her symptoms. As always, she was instructed that for any urgent/emergent care needs, she should seek care either here or in the emergency department for more immediate evaluation.  Follow-up Information    Opthalmology.   Contact information: Need to see eye doctor ASAP.        NOTE: This note was prepared using Lobbyist along with smaller Company secretary. Despite my best ability to proofread, there is the potential that transcriptional errors may still occur from this process, and are completely unintentional.     Karen Kitchens, NP 07/18/19 1215

## 2019-07-17 NOTE — Discharge Instructions (Signed)
It was very nice seeing you today in clinic. Thank you for entrusting me with your care.   I have spoken with the eye doctor on call for recommendations. Use the eye drops as prescribed. You need to see an eye doctor ASAP. Let you supervisor know tonight so that they can tell you who is approved through workers compensation. You need to be seen tomorrow.   If your symptoms/condition worsens, please seek follow up care either here or in the ER. Please remember, our [facility] providers are "right here with you" when you need [place].   Again, it was my pleasure to take care of you today. Thank you for choosing our clinic. I hope that you start to feel better quickly.   Honor Loh, MSN, APRN, FNP-C, CEN Advanced Practice Provider [facility]

## 2019-08-13 ENCOUNTER — Other Ambulatory Visit: Payer: Self-pay

## 2019-08-13 ENCOUNTER — Ambulatory Visit
Admission: EM | Admit: 2019-08-13 | Discharge: 2019-08-13 | Disposition: A | Discharge status: Home / Self Care | Payer: 59 | Attending: Family Medicine | Admitting: Family Medicine

## 2019-08-13 DIAGNOSIS — M545 Low back pain, unspecified: Secondary | ICD-10-CM

## 2019-08-13 MED ORDER — MELOXICAM 15 MG PO TABS: 15.0000 mg | ORAL_TABLET | Freq: Every day | ORAL | 0 refills | Status: AC | PRN

## 2019-08-13 MED ORDER — TIZANIDINE HCL 4 MG PO TABS: 4.0000 mg | ORAL_TABLET | Freq: Three times a day (TID) | ORAL | 0 refills | Status: AC | PRN

## 2019-08-13 NOTE — Discharge Instructions (Signed)
Rest.  Heat.  Medication as prescribed.  Take care  Dr. Mirinda Monte  

## 2019-08-13 NOTE — ED Provider Notes (Signed)
[facility]    CSN: [id number] Arrival date & time: 08/13/19  1306  History   Chief Complaint Chief Complaint  Patient presents with  . Back Pain   HPI  58 year old female presents with back pain.  Patient reports that her pain started on Friday.  She has been moving recently and believes that this was brought on by lifting boxes.  Patient reports that her pain is located on the left side of the low back.  Patient states that she feels like she has a "catch" in her back.  Worse with movement.  Worse with reaching.  She has taken Advil with some improvement but no resolution.  No radicular symptoms.  No incontinence.  Patient rates her pain as 6/10 in severity.  No other associated symptoms.  No other complaints.  History reviewed and updated as below. No significant PMH.  Past Surgical History:  Procedure Laterality Date  . CESAREAN SECTION    . CESAREAN SECTION     OB History   No obstetric history on file.      Home Medications    Prior to Admission medications   Medication Sig Start Date End Date Taking? Authorizing Provider  meloxicam (MOBIC) 15 MG tablet Take 1 tablet (15 mg total) by mouth daily as needed. 08/13/19   Tommie Sams, DO  tiZANidine (ZANAFLEX) 4 MG tablet Take 1 tablet (4 mg total) by mouth every 8 (eight) hours as needed for muscle spasms. 08/13/19   Tommie Sams, DO   Social History Social History   Tobacco Use  . Smoking status: Current Some Day Smoker    Types: Cigarettes  . Smokeless tobacco: Never Used  Substance Use Topics  . Alcohol use: Yes    Comment: socially  . Drug use: Not Currently     Allergies   Patient has no known allergies.   Review of Systems Review of Systems  Constitutional: Negative.   Musculoskeletal: Positive for back pain.   Physical Exam Triage Vital Signs ED Triage Vitals  Enc Vitals Group     BP 08/13/19 1320 (!) 110/47     Pulse Rate 08/13/19 1320 91     Resp 08/13/19 1320 18     Temp  08/13/19 1320 98.1 F (36.7 C)     Temp Source 08/13/19 1320 Oral     SpO2 08/13/19 1320 100 %     Weight 08/13/19 1317 160 lb (72.6 kg)     Height 08/13/19 1317 5\' 2"  (1.575 m)     Head Circumference --      Peak Flow --      Pain Score 08/13/19 1317 6     Pain Loc --      Pain Edu? --      Excl. in GC? --    Updated Vital Signs BP (!) 110/47 (BP Location: Left Arm)   Pulse 91   Temp 98.1 F (36.7 C) (Oral)   Resp 18   Ht 5\' 2"  (1.575 m)   Wt 72.6 kg   SpO2 100%   BMI 29.26 kg/m   Visual Acuity Right Eye Distance:   Left Eye Distance:   Bilateral Distance:    Right Eye Near:   Left Eye Near:    Bilateral Near:     Physical Exam Vitals signs and nursing note reviewed.  Constitutional:      General: She is not in acute distress.    Appearance: Normal appearance. She is not ill-appearing.  HENT:  Head: Normocephalic and atraumatic.  Eyes:     General:        Right eye: No discharge.        Left eye: No discharge.     Conjunctiva/sclera: Conjunctivae normal.  Cardiovascular:     Rate and Rhythm: Normal rate and regular rhythm.  Pulmonary:     Effort: Pulmonary effort is normal.     Breath sounds: Normal breath sounds. No wheezing, rhonchi or rales.  Musculoskeletal:     Comments: Tension and tenderness to palpation of the left paraspinal musculature of the lumbar spine.  Neurological:     Mental Status: She is alert.  Psychiatric:        Mood and Affect: Mood normal.        Behavior: Behavior normal.    UC Treatments / Results  Labs (all labs ordered are listed, but only abnormal results are displayed) Labs Reviewed - No data to display  EKG   Radiology No results found.  Procedures Procedures (including critical care time)  Medications Ordered in UC Medications - No data to display  Initial Impression / Assessment and Plan / UC Course  I have reviewed the triage vital signs and the nursing notes.  Pertinent labs & imaging results that  were available during my care of the patient were reviewed by me and considered in my medical decision making (see chart for details).    58 year old female presents with low back pain.  Treating with meloxicam and Zanaflex.  Supportive care.  Final Clinical Impressions(s) / UC Diagnoses   Final diagnoses:  Acute left-sided low back pain without sciatica     Discharge Instructions     Rest.  Heat.  Medication as prescribed.  Take care  Dr. Lacinda Axon    ED Prescriptions    Medication Sig Dispense Auth. Provider   meloxicam (MOBIC) 15 MG tablet Take 1 tablet (15 mg total) by mouth daily as needed. 30 tablet Kohlton Gilpatrick G, DO   tiZANidine (ZANAFLEX) 4 MG tablet Take 1 tablet (4 mg total) by mouth every 8 (eight) hours as needed for muscle spasms. 30 tablet Coral Spikes, DO     PDMP not reviewed this encounter.   Coral Spikes, [place] 08/13/19 1347

## 2019-08-13 NOTE — ED Triage Notes (Signed)
Patient complains of left lower back pain that occurred after packing boxes this weekend because she is moving. States that pain is worse when she is reaching.

## 2019-12-18 IMAGING — CT CT HEAD WITHOUT CONTRAST
1 series · 16 of 30 positions shown, 20 images · non-contrast
Comparison: None.

CLINICAL DATA: 58-year-old female with dizziness

EXAM:
CT HEAD WITHOUT CONTRAST
TECHNIQUE: Contiguous axial images were obtained from the base of the skull
through the vertex without intravenous contrast.

[Series 3: head bone · axial · 0.41mm/px · z∈[-63,+63]mm · 16 of 69 slices shown, 20 images]
[im 3/69  brain]
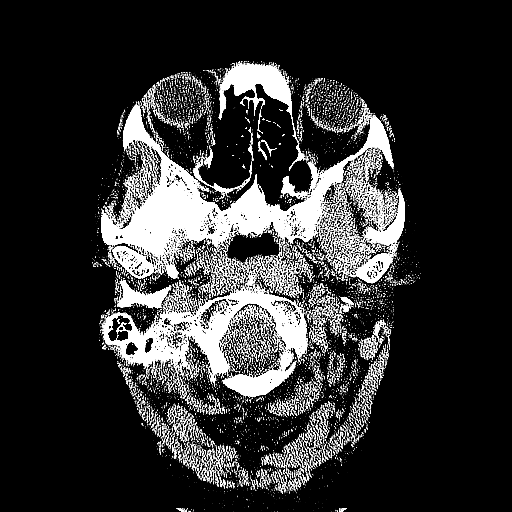
[im 3/69  bone]
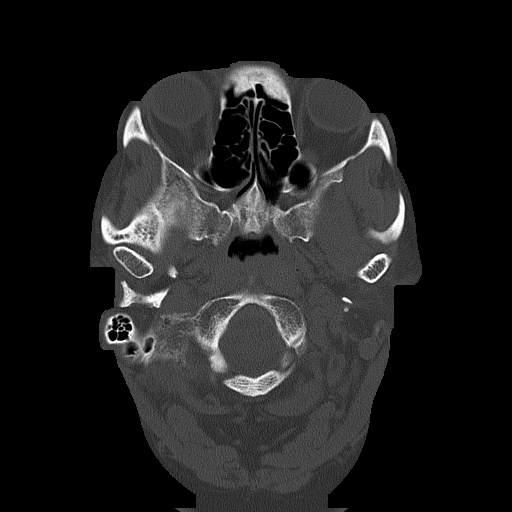
[im 8/69  brain]
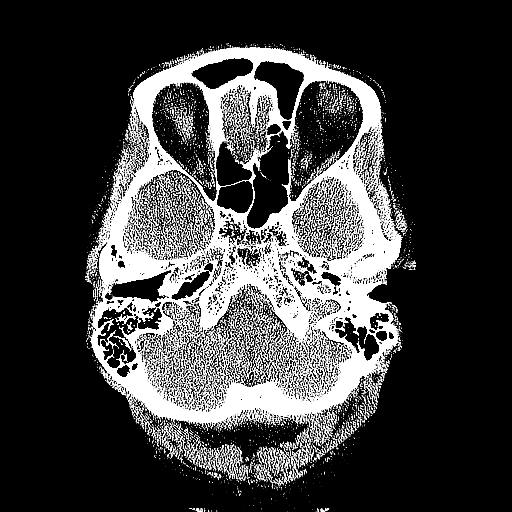
[im 12/69  brain]
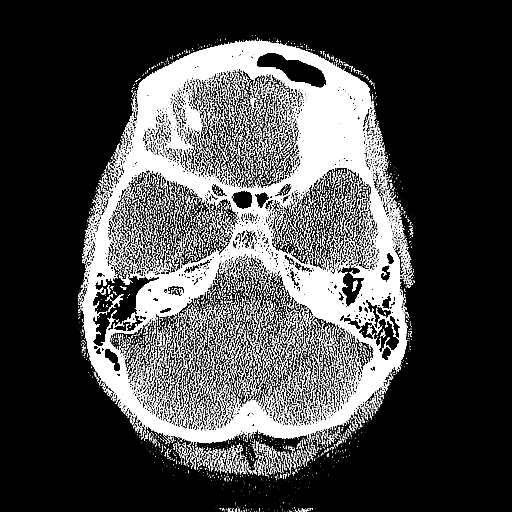
[im 17/69  brain]
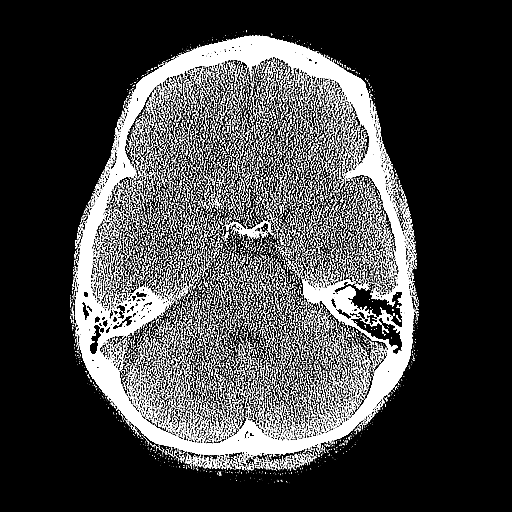
[im 19/69  brain]
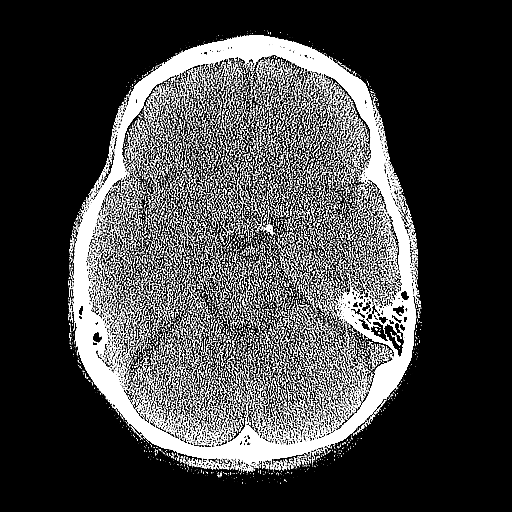
[im 19/69  bone]
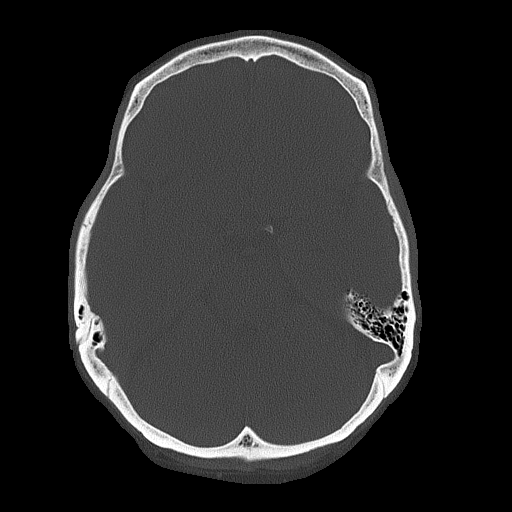
[im 24/69  brain]
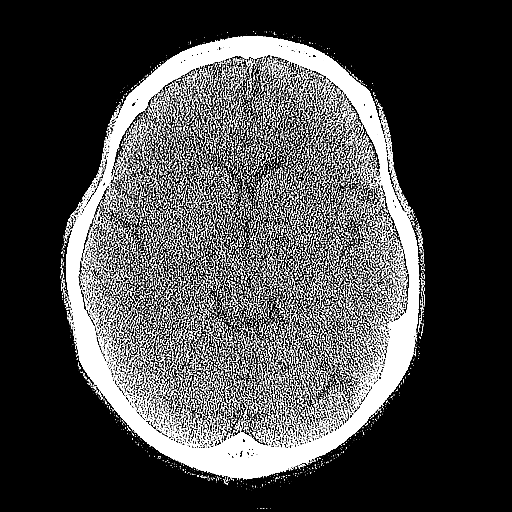
[im 29/69  brain]
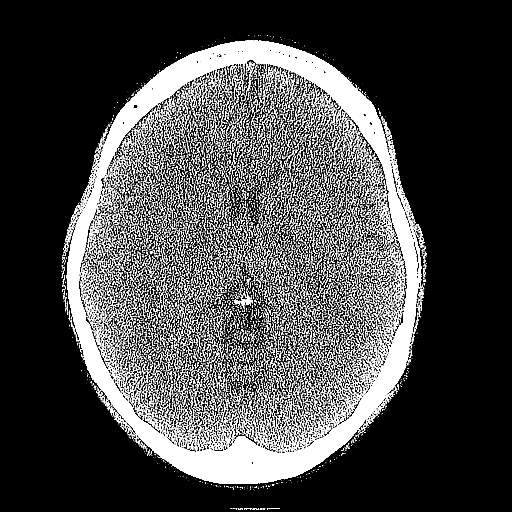
[im 33/69  brain]
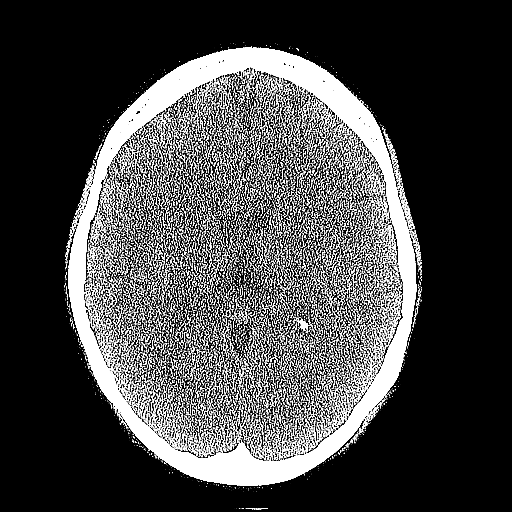
[im 36/69  brain]
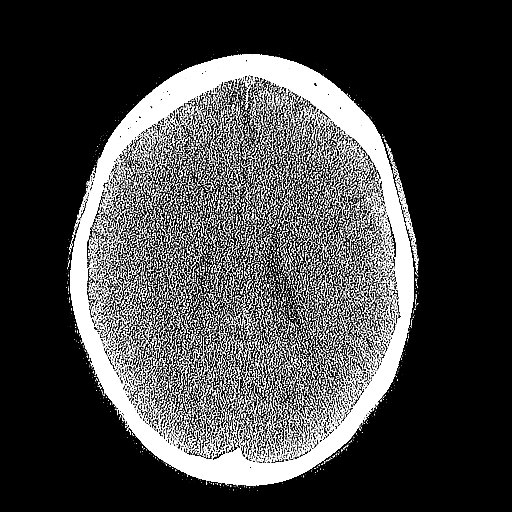
[im 36/69  bone]
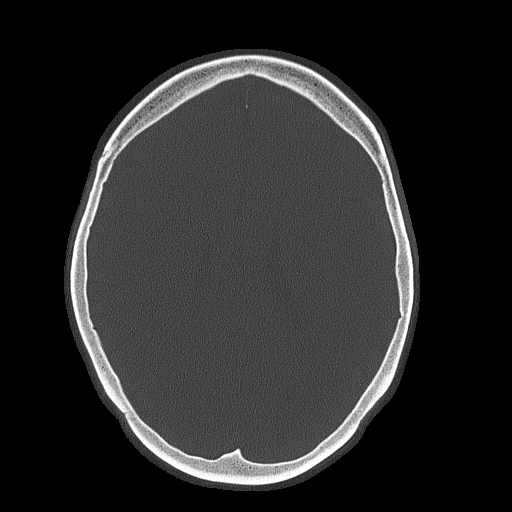
[im 40/69  brain]
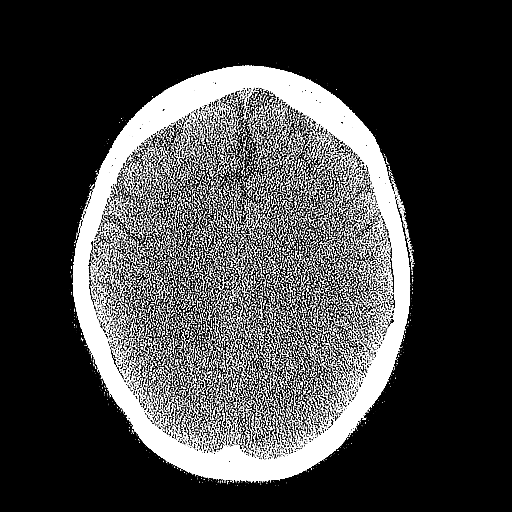
[im 45/69  brain]
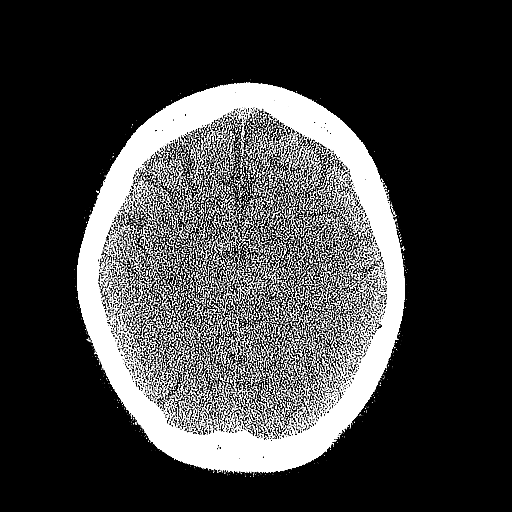
[im 50/69  brain]
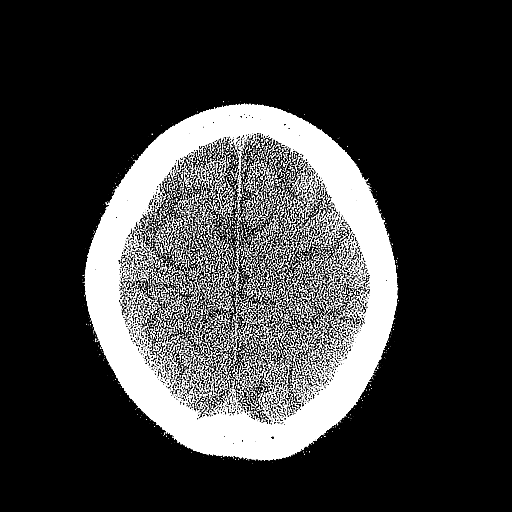
[im 52/69  brain]
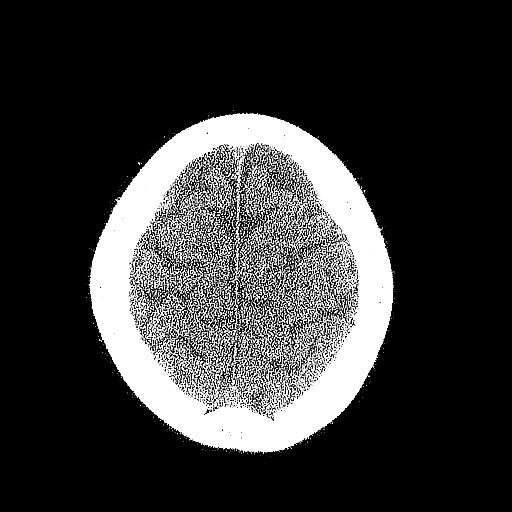
[im 52/69  bone]
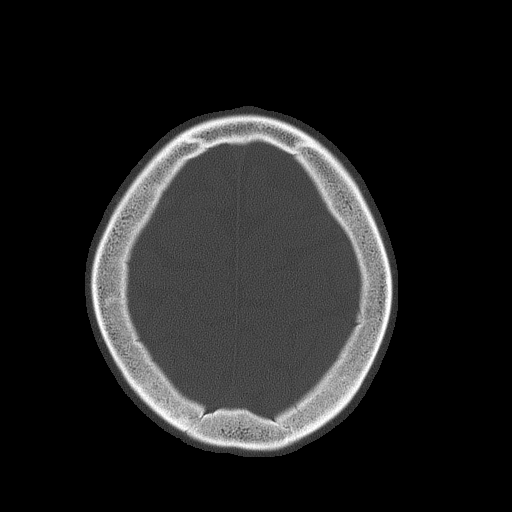
[im 57/69  brain]
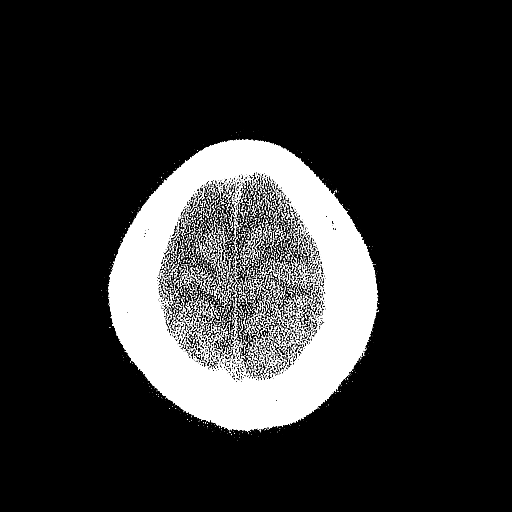
[im 61/69  brain]
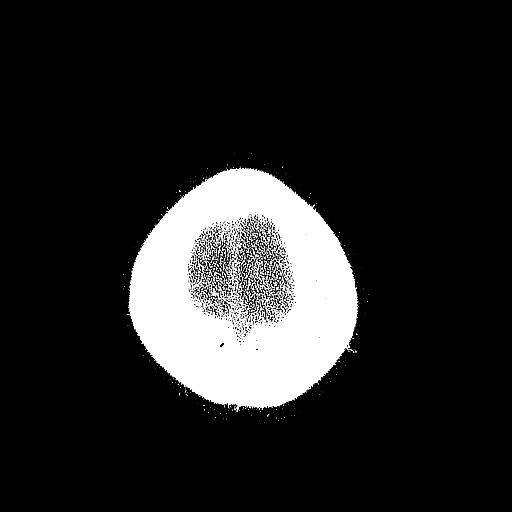
[im 66/69  brain]
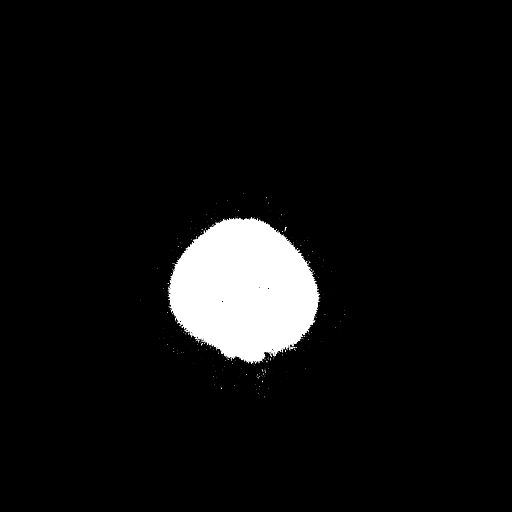

[16 of 30 positions shown; findings below may reference images not displayed]

FINDINGS: Brain: No acute intracranial hemorrhage. No midline shift or mass
effect. Gray-white differentiation maintained. Unremarkable
appearance of the ventricular system.

Vascular: Unremarkable.

Skull: No acute fracture.  No aggressive bone lesion identified.

Sinuses/Orbits: Unremarkable appearance of the orbits. Mastoid air
cells clear. No middle ear effusion. No significant sinus disease.

Other: None
IMPRESSION: Negative for acute intracranial abnormality
